# Patient Record
Sex: Male | Born: 2000
Health system: Southern US, Community
[De-identification: ages and names within clinical notes are randomized; demographics above are authoritative.]

## PROBLEM LIST (undated history)

## (undated) DIAGNOSIS — F32A Depression, unspecified: Secondary | ICD-10-CM

## (undated) DIAGNOSIS — F419 Anxiety disorder, unspecified: Secondary | ICD-10-CM

## (undated) HISTORY — PX: TONSILECTOMY, ADENOIDECTOMY, BILATERAL MYRINGOTOMY AND TUBES: SHX2538

## (undated) HISTORY — DX: Anxiety disorder, unspecified: F41.9

## (undated) HISTORY — DX: Depression, unspecified: F32.A

---
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
## 2018-01-20 ENCOUNTER — Ambulatory Visit: Payer: 59 | Admitting: Pediatrics

## 2018-01-30 ENCOUNTER — Ambulatory Visit: Payer: 59 | Admitting: Pediatrics

## 2018-01-30 ENCOUNTER — Encounter: Payer: Self-pay | Admitting: Pediatrics

## 2018-01-30 ENCOUNTER — Ambulatory Visit (INDEPENDENT_AMBULATORY_CARE_PROVIDER_SITE_OTHER): Payer: 59 | Admitting: Pediatrics

## 2018-01-30 VITALS — BP 138/78 | HR 83 | Ht 73.62 in | Wt 213.2 lb

## 2018-01-30 DIAGNOSIS — F649 Gender identity disorder, unspecified: Secondary | ICD-10-CM

## 2018-01-30 DIAGNOSIS — E349 Endocrine disorder, unspecified: Secondary | ICD-10-CM

## 2018-01-30 DIAGNOSIS — F4323 Adjustment disorder with mixed anxiety and depressed mood: Secondary | ICD-10-CM | POA: Diagnosis not present

## 2018-01-30 MED ORDER — BUPROPION HCL ER (XL) 300 MG PO TB24: 300.0000 mg | ORAL_TABLET | Freq: Every day | ORAL | 3 refills | Status: DC

## 2018-01-30 NOTE — Progress Notes (Signed)
History was provided by the patient.  Bruce Zuniga is a 18 y.o. male who is here for f/u of wellbutrin addition.  Trude Mcburney, FNP   HPI:  Pt reports that he hasn't noticed much of a change yet but no negative changes in mood like blunted affected. He is sleeping well at night.   Depression 4/10, anxiety 6/10. No thoughts of self harm.   Back to school on 13th. Switching majors. Art history/graphic design major.   No concerns with hormone medications. Still unable to take estradiol twice a day- taking 1-2x/week when he remembers.   No LMP for male patient.  Review of Systems  Constitutional: Negative for malaise/fatigue.  Eyes: Negative for double vision.  Respiratory: Negative for shortness of breath.   Cardiovascular: Negative for chest pain and palpitations.  Gastrointestinal: Negative for abdominal pain, constipation, diarrhea, nausea and vomiting.  Genitourinary: Negative for dysuria.  Musculoskeletal: Negative for joint pain and myalgias.  Skin: Negative for rash.  Neurological: Negative for dizziness and headaches.  Endo/Heme/Allergies: Does not bruise/bleed easily.  Psychiatric/Behavioral: Positive for depression. Negative for suicidal ideas. The patient is nervous/anxious. The patient does not have insomnia.     Patient Active Problem List   Diagnosis Date Noted  . Gender dysphoria 08/30/2017  . Family history of SIDS (sudden infant death syndrome) 05-08-2016  . Heart palpitations 05-08-2016  . Pectus excavatum 2016/05/08  . Precordial chest pain 05/08/16    Current Outpatient Medications on File Prior to Visit  Medication Sig Dispense Refill  . buPROPion (WELLBUTRIN XL) 150 MG 24 hr tablet Take 1 tablet (150 mg total) by mouth every morning. 30 tablet 2  . busPIRone (BUSPAR) 10 MG tablet Take 10 mg by mouth 3 (three) times daily.    Marland Kitchen estradiol (ESTRACE) 2 MG tablet Take 1 tablet (2 mg total) by mouth 2 (two) times daily. 180 tablet 1  .  medroxyPROGESTERone (PROVERA) 5 MG tablet Take 1 tablet (5 mg total) by mouth daily. 90 tablet 1  . sertraline (ZOLOFT) 50 MG tablet Take 50 mg by mouth daily.    Marland Kitchen spironolactone (ALDACTONE) 100 MG tablet Take 1.5 tablets daily for 2 weeks. If well tolerated after 2 weeks, increase to 2 tablets daily 60 tablet 2   No current facility-administered medications on file prior to visit.     Not on File   Physical Exam:    Vitals:   01/30/18 1058  BP: 138/78  Pulse: 83  Weight: 213 lb 3.2 oz (96.7 kg)  Height: 6' 1.62" (1.87 m)    Blood pressure percentiles are not available for patients who are 18 years or older.  Physical Exam Vitals signs reviewed.  Constitutional:      Appearance: He is well-developed.  HENT:     Head: Normocephalic.  Neck:     Thyroid: No thyromegaly.  Cardiovascular:     Rate and Rhythm: Normal rate and regular rhythm.     Heart sounds: Normal heart sounds.  Pulmonary:     Effort: Pulmonary effort is normal.     Breath sounds: Normal breath sounds.  Abdominal:     General: Bowel sounds are normal.     Palpations: Abdomen is soft.  Musculoskeletal: Normal range of motion.  Lymphadenopathy:     Cervical: No cervical adenopathy.  Skin:    General: Skin is warm and dry.  Neurological:     Mental Status: He is alert and oriented to person, place, and time.     Assessment/Plan:  1. Endocrine disorder Continue spironolactone, estradiol and provera daily. Discussed strategies for remembering second dose of estradiol.   2. Gender dysphoria Overall stable.   3. Adjustment disorder with mixed anxiety and depressed mood Will increase wellbutrin to 300 mg daily and continue low dose of zoloft. Still taking buspar, usually about twice a day. Overall please wellbutrin hasn't caused any blunting.  - buPROPion (WELLBUTRIN XL) 300 MG 24 hr tablet; Take 1 tablet (300 mg total) by mouth daily.  Dispense: 30 tablet; Refill: 3

## 2018-02-14 ENCOUNTER — Other Ambulatory Visit: Payer: Self-pay | Admitting: Pediatrics

## 2018-02-14 MED ORDER — BUPROPION HCL ER (XL) 150 MG PO TB24: 150.0000 mg | ORAL_TABLET | ORAL | 2 refills | Status: DC

## 2018-02-20 ENCOUNTER — Telehealth: Payer: Self-pay

## 2018-02-20 ENCOUNTER — Other Ambulatory Visit: Payer: Self-pay | Admitting: Pediatrics

## 2018-02-20 DIAGNOSIS — E349 Endocrine disorder, unspecified: Secondary | ICD-10-CM

## 2018-02-20 MED ORDER — SPIRONOLACTONE 100 MG PO TABS: 100.0000 mg | ORAL_TABLET | Freq: Two times a day (BID) | ORAL | 2 refills | Status: DC

## 2018-02-20 NOTE — Telephone Encounter (Signed)
Rec'd patient request asking if spironolactone could be sent to [facility]. Routing to provider.

## 2018-02-20 NOTE — Telephone Encounter (Signed)
Done

## 2018-02-20 NOTE — Telephone Encounter (Signed)
A user error has taken place: encounter opened in error, closed for administrative reasons.

## 2018-02-20 NOTE — Telephone Encounter (Signed)
PA submitted. Should take around 24-72 hours.PA number: [id number]

## 2018-03-15 ENCOUNTER — Encounter: Payer: Self-pay | Admitting: Pediatrics

## 2018-04-21 ENCOUNTER — Other Ambulatory Visit: Payer: Self-pay | Admitting: Pediatrics

## 2018-04-21 DIAGNOSIS — E349 Endocrine disorder, unspecified: Secondary | ICD-10-CM

## 2018-05-02 ENCOUNTER — Other Ambulatory Visit: Payer: Self-pay | Admitting: Pediatrics

## 2018-05-02 DIAGNOSIS — E349 Endocrine disorder, unspecified: Secondary | ICD-10-CM

## 2018-06-02 ENCOUNTER — Other Ambulatory Visit: Payer: Self-pay | Admitting: Pediatrics

## 2018-06-02 ENCOUNTER — Telehealth: Payer: Self-pay

## 2018-06-02 DIAGNOSIS — E349 Endocrine disorder, unspecified: Secondary | ICD-10-CM

## 2018-06-02 MED ORDER — MEDROXYPROGESTERONE ACETATE 5 MG PO TABS: 5.0000 mg | ORAL_TABLET | Freq: Every day | ORAL | 1 refills | Status: DC

## 2018-06-02 NOTE — Telephone Encounter (Signed)
Done

## 2018-06-02 NOTE — Telephone Encounter (Signed)
[facility] called asking for collaborating physician information.

## 2018-07-03 ENCOUNTER — Other Ambulatory Visit: Payer: Self-pay | Admitting: Pediatrics

## 2018-07-03 MED ORDER — ONDANSETRON HCL 8 MG PO TABS: 8.0000 mg | ORAL_TABLET | Freq: Three times a day (TID) | ORAL | 0 refills | Status: DC | PRN

## 2018-07-05 ENCOUNTER — Other Ambulatory Visit: Payer: Self-pay | Admitting: Family

## 2018-07-05 ENCOUNTER — Telehealth: Payer: Self-pay

## 2018-07-05 MED ORDER — SERTRALINE HCL 50 MG PO TABS: 50.0000 mg | ORAL_TABLET | Freq: Every day | ORAL | 0 refills | Status: DC

## 2018-07-05 NOTE — Telephone Encounter (Signed)
Refill request received from [facility] for Sertraline refill.Routing to provider.

## 2018-07-09 ENCOUNTER — Other Ambulatory Visit: Payer: Self-pay | Admitting: Family

## 2018-07-19 ENCOUNTER — Telehealth: Payer: Self-pay | Admitting: Family

## 2018-07-19 NOTE — Telephone Encounter (Signed)
Mom LVM re: scheduling an follow up with Alyse Low the week of July 20 just for a check in and lab draw to check hormone levels. Per mom, Bruce Zuniga has been "feeling kind of off lately."  LVM for mom letting her know that Alyse Low is on vacation that week, however we could potentially schedule Bruce Zuniga for a lab visit that week, then a virtual follow with [facility] for the following week. Routed to [place] as an [place].

## 2018-07-25 ENCOUNTER — Other Ambulatory Visit: Payer: Self-pay | Admitting: Family

## 2018-08-18 ENCOUNTER — Other Ambulatory Visit: Payer: Self-pay | Admitting: Pediatrics

## 2018-08-21 ENCOUNTER — Encounter: Payer: Self-pay | Admitting: Family

## 2018-08-21 ENCOUNTER — Ambulatory Visit (INDEPENDENT_AMBULATORY_CARE_PROVIDER_SITE_OTHER): Payer: 59 | Admitting: Family

## 2018-08-21 ENCOUNTER — Other Ambulatory Visit: Payer: Self-pay

## 2018-08-21 VITALS — BP 131/85 | HR 82 | Ht 73.62 in | Wt 216.6 lb

## 2018-08-21 DIAGNOSIS — F4323 Adjustment disorder with mixed anxiety and depressed mood: Secondary | ICD-10-CM

## 2018-08-21 DIAGNOSIS — R10819 Abdominal tenderness, unspecified site: Secondary | ICD-10-CM | POA: Diagnosis not present

## 2018-08-21 DIAGNOSIS — F649 Gender identity disorder, unspecified: Secondary | ICD-10-CM | POA: Diagnosis not present

## 2018-08-21 DIAGNOSIS — E349 Endocrine disorder, unspecified: Secondary | ICD-10-CM | POA: Diagnosis not present

## 2018-08-21 DIAGNOSIS — Z113 Encounter for screening for infections with a predominantly sexual mode of transmission: Secondary | ICD-10-CM

## 2018-08-21 MED ORDER — HYDROXYZINE HCL 10 MG PO TABS: 10.0000 mg | ORAL_TABLET | Freq: Three times a day (TID) | ORAL | 0 refills | Status: DC | PRN

## 2018-08-21 NOTE — Patient Instructions (Addendum)
Please try taking miralax to help with constipation. You can try doing 1 cap per day to become more regular (goal is to have one soft stool per day), can decrease the amount of miralax if needed.   Today we did genesight testing which will help [place] know how to best manage your medications. Keep taking Wellbutrin 150 mg daily and since you are not taking the Buspar consistently, you can stop that medicine.   I have prescribed Hydroxyzine 10 mg to help with breakthrough anxiety and sleep.    Once you have taken the Miralax clean-out, you can try ibuprofen 800 mg up to three times daily for your back pain.   Please send me a My Chart message in a few days to let me know how you are feeling.   I have scheduled you for a video visit in 2 weeks to discuss medications.
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
## 2018-08-22 ENCOUNTER — Encounter: Payer: Self-pay | Admitting: Family

## 2018-08-22 LAB — COMPREHENSIVE METABOLIC PANEL
AG Ratio: 1.6 (calc) (ref 1.0–2.5)
ALT: 7 U/L — ABNORMAL LOW (ref 8–46)
AST: 12 U/L (ref 12–32)
Albumin: 4.6 g/dL (ref 3.6–5.1)
Alkaline phosphatase (APISO): 79 U/L (ref 46–169)
BUN: 10 mg/dL (ref 7–20)
CO2: 23 mmol/L (ref 20–32)
Calcium: 9.8 mg/dL (ref 8.9–10.4)
Chloride: 103 mmol/L (ref 98–110)
Creat: 1.02 mg/dL (ref 0.60–1.26)
Globulin: 2.8 g/dL (calc) (ref 2.1–3.5)
Glucose, Bld: 92 mg/dL (ref 65–99)
Potassium: 4.5 mmol/L (ref 3.8–5.1)
Sodium: 136 mmol/L (ref 135–146)
Total Bilirubin: 0.4 mg/dL (ref 0.2–1.1)
Total Protein: 7.4 g/dL (ref 6.3–8.2)

## 2018-08-22 LAB — ESTRADIOL: Estradiol: 83 pg/mL — ABNORMAL HIGH (ref <=39)

## 2018-08-22 NOTE — Progress Notes (Signed)
Supervising Provider Co-Signature  I reviewed with the resident the medical history and the resident's findings on physical examination.  I discussed with the resident the patient's diagnosis and concur with the treatment plan as documented in the resident's note.  Parthenia Ames, NP

## 2018-08-25 ENCOUNTER — Encounter: Payer: Self-pay | Admitting: Family

## 2018-08-27 LAB — TESTOS,TOTAL,FREE AND SHBG (FEMALE)
Free Testosterone: 28.6 pg/mL — ABNORMAL LOW (ref 35.0–155.0)
Sex Hormone Binding: 47 nmol/L (ref 10–50)
Testosterone, Total, LC-MS-MS: 212 ng/dL — ABNORMAL LOW (ref 250–1100)

## 2018-09-06 ENCOUNTER — Encounter: Payer: Self-pay | Admitting: Family

## 2018-09-06 ENCOUNTER — Ambulatory Visit (INDEPENDENT_AMBULATORY_CARE_PROVIDER_SITE_OTHER): Payer: 59 | Admitting: Family

## 2018-09-06 DIAGNOSIS — F649 Gender identity disorder, unspecified: Secondary | ICD-10-CM

## 2018-09-06 DIAGNOSIS — F4323 Adjustment disorder with mixed anxiety and depressed mood: Secondary | ICD-10-CM

## 2018-09-06 NOTE — Progress Notes (Addendum)
Virtual Visit via Video Note  I connected with Bruce Zuniga 's patient  on 09/06/18 at  2:30 PM EDT by a video enabled telemedicine application and verified that I am speaking with the correct person using two identifiers.   Location of patient/parent: home   I discussed the limitations of evaluation and management by telemedicine and the availability of in person appointments.  I discussed that the purpose of this telehealth visit is to provide medical care while limiting exposure to the novel coronavirus.  The patient expressed understanding and agreed to proceed.  Reason for visit:  Med check follow up gender dysphoria, anxiety and depression  History of Present Illness:  Bruce Zuniga is a 20 old with PMH significant for gender dysphoria and adjustment disorder with mixed anxiety and depressed mood. They was most recently seen on 7/27. They report they are doing well on current medications which include spironolactone, estradiol and progesterone. Patient reports they are happy and not having any issues or concerns currently. They are not taking zoloft and feeling fine with just continuing wellbutrin at 150 mg daily. They have only needed hydroxyzine sparingly for break through anxiety which has helped. Rest of ROS negative. No other concerns.    Observations/Objective: Patient appears well and is pleasant to talk to.  Assessment and Plan:   Gender dysphoria  -Patient happy with progress -continue spironolactone, estradiol and progesterone. Labs look good according to medication regiment   Adjustment disorder with mixed anxiety and depressed mood  -continue wellbutrin at 150 mg daily -continue hydroxyzine as needed for anxiety PRN -genetic panel completed  Follow Up Instructions: follow up in 3 months   I discussed the assessment and treatment plan with the patient and/or parent/guardian. They were provided an opportunity to ask questions and all were answered. They agreed with the plan and  demonstrated an understanding of the instructions.   They were advised to call back or seek an in-person evaluation in the emergency room if the symptoms worsen or if the condition fails to improve as anticipated.  I spent 15 minutes on this telehealth visit inclusive of face-to-face video and care coordination time I was located at off site location during this encounter.  Mellody Drown, MD

## 2018-09-07 ENCOUNTER — Encounter: Payer: Self-pay | Admitting: Family

## 2018-09-07 NOTE — Progress Notes (Signed)
Supervising Provider Co-Signature  I reviewed with the resident the medical history and the resident's findings on physical examination.  I discussed with the resident the patient's diagnosis and concur with the treatment plan as documented in the resident's note.  Parthenia Ames, NP

## 2018-10-09 ENCOUNTER — Other Ambulatory Visit: Payer: Self-pay | Admitting: Pediatrics

## 2018-10-09 DIAGNOSIS — E349 Endocrine disorder, unspecified: Secondary | ICD-10-CM

## 2018-10-20 ENCOUNTER — Other Ambulatory Visit: Payer: Self-pay | Admitting: Pediatrics

## 2018-10-20 DIAGNOSIS — E349 Endocrine disorder, unspecified: Secondary | ICD-10-CM

## 2018-11-05 ENCOUNTER — Other Ambulatory Visit: Payer: Self-pay | Admitting: Pediatrics

## 2018-11-05 DIAGNOSIS — E349 Endocrine disorder, unspecified: Secondary | ICD-10-CM

## 2019-01-05 ENCOUNTER — Other Ambulatory Visit: Payer: Self-pay | Admitting: Family

## 2019-03-14 ENCOUNTER — Other Ambulatory Visit: Payer: Self-pay | Admitting: Pediatrics

## 2019-03-14 ENCOUNTER — Telehealth (INDEPENDENT_AMBULATORY_CARE_PROVIDER_SITE_OTHER): Payer: Self-pay | Admitting: Pediatrics

## 2019-03-14 DIAGNOSIS — F410 Panic disorder [episodic paroxysmal anxiety] without agoraphobia: Secondary | ICD-10-CM

## 2019-03-14 DIAGNOSIS — F649 Gender identity disorder, unspecified: Secondary | ICD-10-CM

## 2019-03-14 DIAGNOSIS — F4323 Adjustment disorder with mixed anxiety and depressed mood: Secondary | ICD-10-CM

## 2019-03-14 DIAGNOSIS — K5900 Constipation, unspecified: Secondary | ICD-10-CM

## 2019-03-14 MED ORDER — SENNA 8.6 MG PO TABS: 1.0000 | ORAL_TABLET | Freq: Every day | ORAL | 0 refills | Status: DC

## 2019-03-14 MED ORDER — VENLAFAXINE HCL ER 37.5 MG PO CP24: 75.0000 mg | ORAL_CAPSULE | Freq: Every day | ORAL | 0 refills | Status: DC

## 2019-03-14 MED ORDER — HYDROXYZINE HCL 10 MG PO TABS: 10.0000 mg | ORAL_TABLET | Freq: Three times a day (TID) | ORAL | 2 refills | Status: DC | PRN

## 2019-03-14 NOTE — Progress Notes (Signed)
This note is not being shared with the patient for the following reason: To prevent harm (release of this note would result in harm to the life or physical safety of the patient or another).  THIS RECORD MAY CONTAIN CONFIDENTIAL INFORMATION THAT SHOULD NOT BE RELEASED WITHOUT REVIEW OF THE SERVICE PROVIDER.  Virtual Follow-Up Visit via Video Note  I connected with [name] on 03/14/19 at 11:30 AM EST by a video enabled telemedicine application and verified that I am speaking with the correct person using two identifiers.   Patient/parent location: Home in [place]   I discussed the limitations of evaluation and management by telemedicine and the availability of in person appointments.  I discussed that the purpose of this telehealth visit is to provide medical care while limiting exposure to the novel coronavirus.  The patient expressed understanding and agreed to proceed.   Bruce Zuniga is a 19 y.o. male referred by Trude Mcburney, FNP here today for follow-up of adjustment disorder with mixed anxiety/depression  Previsit planning completed:  yes   History was provided by the patient.  Plan from Last Visit:   Gender dysphoria- continue with spironolactone, estradiol, and progesterone at current doses Adjustment disorder- continue wellbutrin 150 mg daily, continue hydroxyzine PRN, gene panel completed   Chief Complaint: Anxiety, panic attacks  History of Present Illness:  Bruce Zuniga is a 19 year old with history of adjustment disorder with mixed anxiety and depression, as well as gender dysphoria that presents with increased anxiety and panic attacks over the last several weeks.   Reports "heavy stress" recently including difficulty finding housing as they will no longer be allowed to stay in their current apartment past August and withdraw of support by parents initially, although they are now providing financial support. Has had difficulty keeping a job during the pandemic.   Feels  that the stress has been building and came to a 'fever pitch' in the last several days where patient reports several panic attacks, one of which led him to the ED for further testing. Labs and EKG were normal but did have elevated BP w/ systolics in [id number]. Had not been using hydroxyzine for breakthrough anxiety as he reports not having realized he had additional pills at home.   Has been doing well taking Wellbutrin 150 mg daily.   Reports medication compliance with spironolactone, estradiol, progesterone. Does not require refills. Has not had labs done since 07/2018.   In addition, reports continues to have issues with constipation which he thinks may be secondary to IBS. Uses miralax when things get really bad but does not have a regular regimen. Flank pain has not been an issue since starting miralax for constipation which he is happy about.   No current SI/HI.   ROS:   All other systems reviewed and negative except as stated in the HPI.   Not on File Outpatient Medications Prior to Visit  Medication Sig Dispense Refill  . buPROPion (WELLBUTRIN XL) 150 MG 24 hr tablet TAKE 1 TABLET BY MOUTH EVERY MORNING 30 tablet 2  . busPIRone (BUSPAR) 10 MG tablet Take 10 mg by mouth 3 (three) times daily.    Marland Kitchen estradiol (ESTRACE) 2 MG tablet TAKE 1 TABLET BY MOUTH  TWICE DAILY 180 tablet 3  . medroxyPROGESTERone (PROVERA) 5 MG tablet TAKE 1 TABLET BY MOUTH  DAILY 90 tablet 3  . ondansetron (ZOFRAN) 8 MG tablet Take 1 tablet (8 mg total) by mouth every 8 (eight) hours as needed for nausea or  vomiting. (Patient not taking: Reported on 08/21/2018) 20 tablet 0  . spironolactone (ALDACTONE) 100 MG tablet TAKE 1 TABLET BY MOUTH TWO  TIMES DAILY 180 tablet 3  . hydrOXYzine (ATARAX/VISTARIL) 10 MG tablet Take 1 tablet (10 mg total) by mouth 3 (three) times daily as needed. 30 tablet 0  . sertraline (ZOLOFT) 50 MG tablet TAKE 1 TABLET BY MOUTH  DAILY (Patient not taking: Reported on 08/21/2018) 90 tablet 0   No  facility-administered medications prior to visit.     Patient Active Problem List   Diagnosis Date Noted  . Gender dysphoria 08/30/2017  . Family history of SIDS (sudden infant death syndrome) 2016-04-24  . Heart palpitations 04-24-16  . Pectus excavatum 24-Apr-2016  . Precordial chest pain 04/24/16    Visual Observations/Objective: General Appearance: Well nourished well developed, in no apparent distress.  Eyes: conjunctiva normal ENT/Mouth: No hoarseness, No cough for duration of visit.  Respiratory: Respiratory effort normal, normal rate, no retractions or distress.   Cardio: Appears well-perfused, noncyanotic Musculoskeletal: no obvious deformity Skin: visible skin without rashes, ecchymosis, erythema Neuro: Awake and oriented X 3,  Psych:  normal affect, Insight and Judgment appropriate.    Assessment/Plan: 1. Adjustment disorder with mixed anxiety and depressed mood Given increased anxiety and frequency of panic attacks over last several weeks with additional situational stress, will start Effexor (37.5 mg x 1 week, then increase to 75 mg) based on gene panel testing.  Continue wellbutrin 150 mg daily Continue hydroxyzine 10 mg TID PRN breakthrough anxiety - hydrOXYzine (ATARAX/VISTARIL) 10 MG tablet; Take 1 tablet (10 mg total) by mouth 3 (three) times daily as needed.  Dispense: 90 tablet; Refill: 2 - venlafaxine XR (EFFEXOR XR) 37.5 MG 24 hr capsule; Take 2 capsules (75 mg total) by mouth daily with breakfast. For week 1- take 1 capsule 37.5 mg daily, then increase to 75 mg daily  Dispense: 60 capsule; Refill: 0  2. Gender dysphoria Continue spironolactone, estradiol, progesterone Needs labs at next visit  3. Constipation, unspecified constipation type Continues to have constipation likely secondary to IBS in setting of stress Will start senna daily for regular bowel regimen Miralax PRN  - senna (SENOKOT) 8.6 MG TABS tablet; Take 1 tablet (8.6 mg total) by mouth  daily.  Dispense: 120 tablet; Refill: 0   I discussed the assessment and treatment plan with the patient and/or parent/guardian.  They were provided an opportunity to ask questions and all were answered.  They agreed with the plan and demonstrated an understanding of the instructions. They were advised to call back or seek an in-person evaluation in the emergency room if the symptoms worsen or if the condition fails to improve as anticipated.   Follow-up:  In 2-3 weeks for in person visit for labs, exam  Medical decision-making:   I spent 30 minutes on this telehealth visit inclusive of face-to-face video and care coordination time I was located remotely during this encounter.   Dorna Leitz, MD    CC: Trude Mcburney, FNP, Trude Mcburney, FNP

## 2019-03-16 ENCOUNTER — Other Ambulatory Visit: Payer: Self-pay | Admitting: Pediatrics

## 2019-03-16 DIAGNOSIS — E349 Endocrine disorder, unspecified: Secondary | ICD-10-CM

## 2019-03-16 MED ORDER — ESTRADIOL 2 MG PO TABS: 2.0000 mg | ORAL_TABLET | Freq: Two times a day (BID) | ORAL | 3 refills | Status: DC

## 2019-03-16 NOTE — Progress Notes (Signed)
I have reviewed the resident's note and plan of care and helped develop the plan as necessary.  Phi Avans, FNP   

## 2019-03-26 ENCOUNTER — Ambulatory Visit: Payer: Self-pay | Admitting: Pediatrics

## 2019-03-28 ENCOUNTER — Telehealth: Payer: Self-pay | Admitting: Pediatrics

## 2019-04-14 ENCOUNTER — Other Ambulatory Visit: Payer: Self-pay | Admitting: Student

## 2019-04-14 DIAGNOSIS — F4323 Adjustment disorder with mixed anxiety and depressed mood: Secondary | ICD-10-CM

## 2019-04-23 ENCOUNTER — Other Ambulatory Visit: Payer: Self-pay | Admitting: Pediatrics

## 2019-04-23 MED ORDER — BUPROPION HCL ER (XL) 150 MG PO TB24: 150.0000 mg | ORAL_TABLET | Freq: Every morning | ORAL | 2 refills | Status: DC

## 2019-04-23 MED ORDER — VENLAFAXINE HCL ER 75 MG PO CP24: 75.0000 mg | ORAL_CAPSULE | Freq: Every day | ORAL | 2 refills | Status: DC

## 2019-06-21 ENCOUNTER — Other Ambulatory Visit: Payer: Self-pay | Admitting: Pediatrics

## 2019-06-21 MED ORDER — BUPROPION HCL ER (XL) 150 MG PO TB24: 150.0000 mg | ORAL_TABLET | Freq: Every morning | ORAL | 2 refills | Status: DC

## 2019-06-22 ENCOUNTER — Encounter: Payer: Self-pay | Admitting: Pediatrics

## 2019-07-04 ENCOUNTER — Telehealth: Payer: Self-pay | Admitting: General Practice

## 2019-07-04 NOTE — Telephone Encounter (Signed)

## 2019-07-05 ENCOUNTER — Ambulatory Visit: Payer: Commercial Managed Care - PPO | Admitting: *Deleted

## 2019-07-05 DIAGNOSIS — E349 Endocrine disorder, unspecified: Secondary | ICD-10-CM

## 2019-07-05 DIAGNOSIS — F4323 Adjustment disorder with mixed anxiety and depressed mood: Secondary | ICD-10-CM

## 2019-07-05 NOTE — Progress Notes (Signed)
Patient came in for labs Testos, Total, Free and SHBG (Male), CMP, CBC with diff, and Vitamin D 25 Hydroxy. Labs ordered by Jonathon Resides. Successful collection.

## 2019-07-08 LAB — COMPREHENSIVE METABOLIC PANEL
AG Ratio: 1.8 (calc) (ref 1.0–2.5)
ALT: 8 U/L — ABNORMAL LOW (ref 9–46)
AST: 13 U/L (ref 10–40)
Albumin: 4.6 g/dL (ref 3.6–5.1)
Alkaline phosphatase (APISO): 86 U/L (ref 36–130)
BUN: 14 mg/dL (ref 7–25)
CO2: 25 mmol/L (ref 20–32)
Calcium: 9.9 mg/dL (ref 8.6–10.3)
Chloride: 102 mmol/L (ref 98–110)
Creat: 1.16 mg/dL (ref 0.60–1.35)
Globulin: 2.6 g/dL (calc) (ref 1.9–3.7)
Glucose, Bld: 94 mg/dL (ref 65–99)
Potassium: 4.8 mmol/L (ref 3.5–5.3)
Sodium: 136 mmol/L (ref 135–146)
Total Bilirubin: 0.5 mg/dL (ref 0.2–1.2)
Total Protein: 7.2 g/dL (ref 6.1–8.1)

## 2019-07-08 LAB — CBC WITH DIFFERENTIAL/PLATELET
Absolute Monocytes: 350 cells/uL (ref 200–950)
Basophils Absolute: 59 cells/uL (ref 0–200)
Basophils Relative: 0.9 %
Eosinophils Absolute: 92 cells/uL (ref 15–500)
Eosinophils Relative: 1.4 %
HCT: 45.8 % (ref 38.5–50.0)
Hemoglobin: 15.3 g/dL (ref 13.2–17.1)
Lymphs Abs: 2264 cells/uL (ref 850–3900)
MCH: 28.7 pg (ref 27.0–33.0)
MCHC: 33.4 g/dL (ref 32.0–36.0)
MCV: 85.9 fL (ref 80.0–100.0)
MPV: 9.5 fL (ref 7.5–12.5)
Monocytes Relative: 5.3 %
Neutro Abs: 3835 cells/uL (ref 1500–7800)
Neutrophils Relative %: 58.1 %
Platelets: 300 10*3/uL (ref 140–400)
RBC: 5.33 10*6/uL (ref 4.20–5.80)
RDW: 11.8 % (ref 11.0–15.0)
Total Lymphocyte: 34.3 %
WBC: 6.6 10*3/uL (ref 3.8–10.8)

## 2019-07-08 LAB — TESTOS,TOTAL,FREE AND SHBG (FEMALE)
Free Testosterone: 41.3 pg/mL (ref 35.0–155.0)
Sex Hormone Binding: 42 nmol/L (ref 10–50)
Testosterone, Total, LC-MS-MS: 323 ng/dL (ref 250–1100)

## 2019-07-08 LAB — VITAMIN D 25 HYDROXY (VIT D DEFICIENCY, FRACTURES): Vit D, 25-Hydroxy: 15 ng/mL — ABNORMAL LOW (ref 30–100)

## 2019-07-09 ENCOUNTER — Other Ambulatory Visit: Payer: Self-pay | Admitting: Family

## 2019-07-09 MED ORDER — VITAMIN D (ERGOCALCIFEROL) 1.25 MG (50000 UNIT) PO CAPS
50000.0000 [IU] | ORAL_CAPSULE | ORAL | 0 refills | Status: DC
Start: 2019-07-09 — End: 2019-07-16

## 2019-07-16 ENCOUNTER — Other Ambulatory Visit: Payer: Self-pay | Admitting: Pediatrics

## 2019-07-16 MED ORDER — VITAMIN D (ERGOCALCIFEROL) 1.25 MG (50000 UNIT) PO CAPS: 50000.0000 [IU] | ORAL_CAPSULE | ORAL | 0 refills | Status: DC

## 2019-07-18 ENCOUNTER — Other Ambulatory Visit: Payer: Self-pay | Admitting: Pediatrics

## 2019-09-09 ENCOUNTER — Other Ambulatory Visit: Payer: Self-pay | Admitting: Pediatrics

## 2019-11-30 ENCOUNTER — Other Ambulatory Visit: Payer: Self-pay | Admitting: Pediatrics

## 2019-12-11 ENCOUNTER — Other Ambulatory Visit: Payer: Self-pay | Admitting: Pediatrics

## 2020-01-09 ENCOUNTER — Other Ambulatory Visit: Payer: Self-pay | Admitting: Family

## 2020-01-10 ENCOUNTER — Other Ambulatory Visit: Payer: Self-pay | Admitting: Pediatrics

## 2020-01-10 DIAGNOSIS — E349 Endocrine disorder, unspecified: Secondary | ICD-10-CM

## 2020-01-10 MED ORDER — MEDROXYPROGESTERONE ACETATE 5 MG PO TABS: 5.0000 mg | ORAL_TABLET | Freq: Every day | ORAL | 3 refills | Status: DC

## 2020-01-10 MED ORDER — BUPROPION HCL ER (XL) 150 MG PO TB24: 150.0000 mg | ORAL_TABLET | Freq: Every morning | ORAL | 2 refills | Status: DC

## 2020-03-04 ENCOUNTER — Other Ambulatory Visit: Payer: Self-pay | Admitting: Pediatrics

## 2020-03-04 DIAGNOSIS — E349 Endocrine disorder, unspecified: Secondary | ICD-10-CM

## 2020-04-01 ENCOUNTER — Other Ambulatory Visit: Payer: Self-pay | Admitting: Family

## 2020-04-01 ENCOUNTER — Telehealth: Payer: Self-pay

## 2020-04-01 DIAGNOSIS — E349 Endocrine disorder, unspecified: Secondary | ICD-10-CM

## 2020-04-01 MED ORDER — SPIRONOLACTONE 100 MG PO TABS: 100.0000 mg | ORAL_TABLET | Freq: Two times a day (BID) | ORAL | 3 refills | Status: DC

## 2020-04-01 NOTE — Telephone Encounter (Signed)
Pt asked for refill for spironolactone (ALDACTONE) 100 MG tablet. Routing to provider.

## 2020-04-03 NOTE — Telephone Encounter (Signed)
Script refilled

## 2020-04-21 ENCOUNTER — Other Ambulatory Visit: Payer: Self-pay | Admitting: Student

## 2020-04-21 DIAGNOSIS — F4323 Adjustment disorder with mixed anxiety and depressed mood: Secondary | ICD-10-CM

## 2020-05-06 ENCOUNTER — Other Ambulatory Visit: Payer: Self-pay | Admitting: Pediatrics

## 2020-06-09 ENCOUNTER — Other Ambulatory Visit: Payer: Self-pay | Admitting: Pediatrics

## 2020-06-10 ENCOUNTER — Other Ambulatory Visit: Payer: Self-pay | Admitting: Pediatrics

## 2020-10-03 ENCOUNTER — Other Ambulatory Visit: Payer: Self-pay | Admitting: Pediatrics

## 2020-10-15 ENCOUNTER — Other Ambulatory Visit: Payer: Self-pay | Admitting: Pediatrics

## 2021-03-26 ENCOUNTER — Other Ambulatory Visit: Payer: Self-pay | Admitting: Pediatrics

## 2021-04-30 ENCOUNTER — Ambulatory Visit (INDEPENDENT_AMBULATORY_CARE_PROVIDER_SITE_OTHER): Payer: Commercial Managed Care - PPO | Admitting: Medical-Surgical

## 2021-04-30 ENCOUNTER — Encounter: Payer: Self-pay | Admitting: Medical-Surgical

## 2021-04-30 VITALS — BP 133/88 | HR 82 | Resp 20 | Wt 239.0 lb

## 2021-04-30 DIAGNOSIS — Z789 Other specified health status: Secondary | ICD-10-CM | POA: Diagnosis not present

## 2021-04-30 DIAGNOSIS — R002 Palpitations: Secondary | ICD-10-CM | POA: Diagnosis not present

## 2021-04-30 DIAGNOSIS — K219 Gastro-esophageal reflux disease without esophagitis: Secondary | ICD-10-CM

## 2021-04-30 DIAGNOSIS — Z113 Encounter for screening for infections with a predominantly sexual mode of transmission: Secondary | ICD-10-CM

## 2021-04-30 DIAGNOSIS — Z7689 Persons encountering health services in other specified circumstances: Secondary | ICD-10-CM | POA: Diagnosis not present

## 2021-04-30 MED ORDER — FAMOTIDINE 20 MG PO TABS: 20.0000 mg | ORAL_TABLET | Freq: Two times a day (BID) | ORAL | 2 refills | Status: DC

## 2021-04-30 NOTE — Progress Notes (Signed)
? ?New Patient Office Visit ? ?Subjective:  ?Patient ID: Bruce Zuniga, adult    DOB: 2000/10/25  Age: 21 y.o. MRN: [id number] ? ?CC:  ?Chief Complaint  ?Patient presents with  ? Establish Care  ? ? ? ?HPI ?[name] presents to establish care. They are a 21 year old transgender male who is currently on oral Estradiol '4mg'$  daily, Progesterone '200mg'$  daily, and Spironolactone '25mg'$  daily. Has been on this therapy for a while and is stable on this treatment. Last labs done in August with their previous PCP.  ? ?Currently being treated for anxiety and depression with Effexor '75mg'$  and Wellbutrin '150mg'$  daily. Tolerating both medications well without side effects. Feels the medications are working well and notes that missed doses cause some significant symptoms. Denies SI/HI.  ? ?Has been having intermittent fluttering his their chest for several months but over the last couple of weeks has been having this happen around twice per day. This isn't painful and only lasts a second. Not accompanied by any other symptoms. Wonders if it's related to anxiety.  ? ?Having issues with reflux recently. Family history of GERD with complications including throat cancer. Is worried about their reflux being unmanaged. Interested in options for treatment.  ? ?Past Medical History:  ?Diagnosis Date  ? Anxiety   ? Depression   ? ? ?Past Surgical History:  ?Procedure Laterality Date  ? TONSILECTOMY, ADENOIDECTOMY, BILATERAL MYRINGOTOMY AND TUBES    ? ? ?Family History  ?Problem Relation Age of Onset  ? Hypertension Father   ? ? ?Social History  ? ?Socioeconomic History  ? Marital status: Single  ?  Spouse name: Not on file  ? Number of children: Not on file  ? Years of education: Not on file  ? Highest education level: Not on file  ?Occupational History  ? Not on file  ?Tobacco Use  ? Smoking status: Never  ? Smokeless tobacco: Never  ?Substance and Sexual Activity  ? Alcohol use: Yes  ?  Alcohol/week: 4.0 standard drinks  ?  Types: 4  Standard drinks or equivalent per week  ?  Comment: Sometimes  ? Drug use: Yes  ?  Types: Marijuana  ? Sexual activity: Yes  ?  Birth control/protection: Condom  ?  Comment: Transgender  ?Other Topics Concern  ? Not on file  ?Social History Narrative  ? Not on file  ? ?Social Determinants of Health  ? ?Financial Resource Strain: Not on file  ?Food Insecurity: Not on file  ?Transportation Needs: Not on file  ?Physical Activity: Not on file  ?Stress: Not on file  ?Social Connections: Not on file  ?Intimate Partner Violence: Not on file  ? ? ?ROS ?Review of Systems  ?Constitutional:  Negative for chills, fatigue, fever and unexpected weight change.  ?HENT:  Negative for congestion, rhinorrhea, sinus pressure and sore throat.   ?Respiratory:  Negative for cough, chest tightness and shortness of breath.   ?Cardiovascular:  Positive for palpitations. Negative for chest pain and leg swelling.  ?Gastrointestinal:  Positive for abdominal pain (heartburn). Negative for constipation, diarrhea, nausea and vomiting.  ?Endocrine: Negative for cold intolerance and heat intolerance.  ?Genitourinary:  Negative for dysuria, frequency, urgency, vaginal bleeding and vaginal discharge.  ?Skin:  Negative for rash and wound.  ?Neurological:  Negative for dizziness, light-headedness and headaches.  ?Hematological:  Does not bruise/bleed easily.  ?Psychiatric/Behavioral:  Positive for dysphoric mood. Negative for self-injury, sleep disturbance and suicidal ideas. The patient is nervous/anxious.   ? ?Objective:  ? ?  Today's Vitals: BP 133/88   Pulse 82   Resp 20   Wt 239 lb (108.4 kg)   SpO2 98%   BMI 31.00 kg/m?  ? ?Physical Exam ?Vitals reviewed.  ?Constitutional:   ?   General: He is not in acute distress. ?   Appearance: Normal appearance. He is not ill-appearing.  ?HENT:  ?   Head: Normocephalic and atraumatic.  ?Cardiovascular:  ?   Rate and Rhythm: Normal rate and regular rhythm.  ?   Pulses: Normal pulses.  ?   Heart sounds:  Normal heart sounds. No murmur heard. ?  No friction rub. No gallop.  ?Pulmonary:  ?   Effort: Pulmonary effort is normal. No respiratory distress.  ?   Breath sounds: Normal breath sounds. No wheezing.  ?Skin: ?   General: Skin is warm and dry.  ?Neurological:  ?   Mental Status: He is alert and oriented to person, place, and time.  ?Psychiatric:     ?   Mood and Affect: Mood normal.     ?   Behavior: Behavior normal.     ?   Thought Content: Thought content normal.     ?   Judgment: Judgment normal.  ? ? ?Assessment & Plan:  ? ?1. Encounter to establish care ?Reviewed available information and discussed care concerns with patient.  ? ?2. Male-to-male transgender person ?Checking labs. Continue Estradiol, Progesterone, and Spironolactone as prescribed. Plan to titrate medication depending on lab results.  ?- CBC with Differential/Platelet ?- Lipid panel ?- Estradiol ?- Testosterone ?- COMPLETE METABOLIC PANEL WITH GFR ? ?3. Palpitations ?Checking labs as above plus TSH. Possibly related to anxiety. May consider a long term monitor for further evaluation.  ?- TSH ?- COMPLETE METABOLIC PANEL WITH GFR ? ?4. Gastroesophageal reflux disease without esophagitis ?Famotidine '20mg'$  BID prn.  ? ?5. Routine screening for STI (sexually transmitted infection) ?STI testing per patient request.  ?- HIV Antibody (routine testing w rflx) ?- Hepatitis C Antibody ?- Hepatitis B surface antigen ?- RPR ?- C. trachomatis/N. gonorrhoeae RNA ? ? ?Outpatient Encounter Medications as of 04/30/2021  ?Medication Sig  ? buPROPion (WELLBUTRIN XL) 150 MG 24 hr tablet TAKE 1 TABLET BY MOUTH EVERY MORNING  ? estradiol (ESTRACE) 2 MG tablet TAKE 1 TABLET(2 MG) BY MOUTH TWICE DAILY  ? famotidine (PEPCID) 20 MG tablet Take 1 tablet (20 mg total) by mouth 2 (two) times daily.  ? medroxyPROGESTERone (PROVERA) 5 MG tablet Take 1 tablet (5 mg total) by mouth daily.  ? spironolactone (ALDACTONE) 100 MG tablet Take 1 tablet (100 mg total) by mouth 2 (two)  times daily.  ? venlafaxine XR (EFFEXOR-XR) 75 MG 24 hr capsule TAKE 1 CAPSULE(75 MG) BY MOUTH DAILY WITH BREAKFAST  ? [DISCONTINUED] buPROPion (WELLBUTRIN XL) 150 MG 24 hr tablet TAKE 1 TABLET(150 MG) BY MOUTH EVERY MORNING (Patient not taking: Reported on 04/30/2021)  ? [DISCONTINUED] busPIRone (BUSPAR) 10 MG tablet Take 10 mg by mouth 3 (three) times daily. (Patient not taking: Reported on 04/30/2021)  ? [DISCONTINUED] hydrOXYzine (ATARAX/VISTARIL) 10 MG tablet TAKE 1 TABLET(10 MG) BY MOUTH THREE TIMES DAILY AS NEEDED (Patient not taking: Reported on 04/30/2021)  ? [DISCONTINUED] senna (SENOKOT) 8.6 MG TABS tablet Take 1 tablet (8.6 mg total) by mouth daily. (Patient not taking: Reported on 04/30/2021)  ? [DISCONTINUED] Vitamin D, Ergocalciferol, (DRISDOL) 1.25 MG (50000 UNIT) CAPS capsule TAKE 1 CAPSULE BY MOUTH EVERY 7 DAYS (Patient not taking: Reported on 04/30/2021)  ? ?No facility-administered encounter medications  on file as of 04/30/2021.  ? ? ?Follow-up: Return in about 6 months (around 10/30/2021) for transgender care follow up.  ? ?Clearnce Sorrel, DNP, APRN, FNP-BC ?[place] ?Primary Care and Sports Medicine ? ?

## 2021-05-01 ENCOUNTER — Encounter: Payer: Self-pay | Admitting: Medical-Surgical

## 2021-05-01 LAB — COMPLETE METABOLIC PANEL WITH GFR
AG Ratio: 1.7 (calc) (ref 1.0–2.5)
ALT: 8 U/L — ABNORMAL LOW (ref 9–46)
AST: 13 U/L (ref 10–40)
Albumin: 4.5 g/dL (ref 3.6–5.1)
Alkaline phosphatase (APISO): 76 U/L (ref 36–130)
BUN: 10 mg/dL (ref 7–25)
CO2: 27 mmol/L (ref 20–32)
Calcium: 9.6 mg/dL (ref 8.6–10.3)
Chloride: 104 mmol/L (ref 98–110)
Creat: 0.85 mg/dL (ref 0.60–1.24)
Globulin: 2.7 g/dL (calc) (ref 1.9–3.7)
Glucose, Bld: 87 mg/dL (ref 65–139)
Potassium: 3.9 mmol/L (ref 3.5–5.3)
Sodium: 138 mmol/L (ref 135–146)
Total Bilirubin: 0.7 mg/dL (ref 0.2–1.2)
Total Protein: 7.2 g/dL (ref 6.1–8.1)
eGFR: 126 mL/min/{1.73_m2} (ref >=60)

## 2021-05-01 LAB — LIPID PANEL
Cholesterol: 170 mg/dL (ref <200)
HDL: 46 mg/dL (ref >=40)
LDL Cholesterol (Calc): 100 mg/dL (calc) — ABNORMAL HIGH
Non-HDL Cholesterol (Calc): 124 mg/dL (calc) (ref <130)
Total CHOL/HDL Ratio: 3.7 (calc) (ref <5.0)
Triglycerides: 138 mg/dL (ref <150)

## 2021-05-01 LAB — HEPATITIS C ANTIBODY
Hepatitis C Ab: NONREACTIVE
SIGNAL TO CUT-OFF: 0.09 (ref <1.00)

## 2021-05-01 LAB — CBC WITH DIFFERENTIAL/PLATELET
Absolute Monocytes: 360 cells/uL (ref 200–950)
Basophils Absolute: 41 cells/uL (ref 0–200)
Basophils Relative: 0.6 %
Eosinophils Absolute: 122 cells/uL (ref 15–500)
Eosinophils Relative: 1.8 %
HCT: 42.1 % (ref 38.5–50.0)
Hemoglobin: 14.3 g/dL (ref 13.2–17.1)
Lymphs Abs: 2530 cells/uL (ref 850–3900)
MCH: 29.1 pg (ref 27.0–33.0)
MCHC: 34 g/dL (ref 32.0–36.0)
MCV: 85.7 fL (ref 80.0–100.0)
MPV: 9.7 fL (ref 7.5–12.5)
Monocytes Relative: 5.3 %
Neutro Abs: 3747 cells/uL (ref 1500–7800)
Neutrophils Relative %: 55.1 %
Platelets: 279 10*3/uL (ref 140–400)
RBC: 4.91 10*6/uL (ref 4.20–5.80)
RDW: 11.9 % (ref 11.0–15.0)
Total Lymphocyte: 37.2 %
WBC: 6.8 10*3/uL (ref 3.8–10.8)

## 2021-05-01 LAB — RPR: RPR Ser Ql: NONREACTIVE

## 2021-05-01 LAB — TSH: TSH: 1.16 mIU/L (ref 0.40–4.50)

## 2021-05-01 LAB — HIV ANTIBODY (ROUTINE TESTING W REFLEX): HIV 1&2 Ab, 4th Generation: NONREACTIVE

## 2021-05-01 LAB — HEPATITIS B SURFACE ANTIGEN: Hepatitis B Surface Ag: NONREACTIVE

## 2021-05-01 LAB — ESTRADIOL: Estradiol: 71 pg/mL — ABNORMAL HIGH (ref <=39)

## 2021-05-01 LAB — TESTOSTERONE: Testosterone: 330 ng/dL (ref 250–827)

## 2021-05-01 LAB — C. TRACHOMATIS/N. GONORRHOEAE RNA
C. trachomatis RNA, TMA: NOT DETECTED
N. gonorrhoeae RNA, TMA: NOT DETECTED

## 2021-05-02 ENCOUNTER — Encounter: Payer: Self-pay | Admitting: Medical-Surgical

## 2021-05-20 ENCOUNTER — Encounter: Payer: Self-pay | Admitting: Medical-Surgical

## 2021-05-20 DIAGNOSIS — E349 Endocrine disorder, unspecified: Secondary | ICD-10-CM

## 2021-05-21 MED ORDER — SPIRONOLACTONE 25 MG PO TABS: 100.0000 mg | ORAL_TABLET | Freq: Two times a day (BID) | ORAL | 1 refills | Status: DC

## 2021-06-23 ENCOUNTER — Encounter: Payer: Self-pay | Admitting: Medical-Surgical

## 2021-06-29 ENCOUNTER — Other Ambulatory Visit: Payer: Self-pay

## 2021-06-29 ENCOUNTER — Other Ambulatory Visit: Payer: Self-pay | Admitting: Medical-Surgical

## 2021-06-29 MED ORDER — VENLAFAXINE HCL ER 75 MG PO CP24: ORAL_CAPSULE | ORAL | 1 refills | Status: DC

## 2021-09-29 ENCOUNTER — Encounter: Payer: Self-pay | Admitting: Medical-Surgical

## 2021-09-30 ENCOUNTER — Other Ambulatory Visit: Payer: Self-pay | Admitting: Medical-Surgical

## 2021-09-30 MED ORDER — BUPROPION HCL ER (XL) 150 MG PO TB24: 150.0000 mg | ORAL_TABLET | Freq: Every morning | ORAL | 2 refills | Status: DC

## 2021-10-05 ENCOUNTER — Other Ambulatory Visit: Payer: Self-pay

## 2021-10-05 MED ORDER — FAMOTIDINE 20 MG PO TABS
20.0000 mg | ORAL_TABLET | Freq: Two times a day (BID) | ORAL | 0 refills | Status: DC
Start: 2021-10-05 — End: 2021-11-05

## 2021-11-05 ENCOUNTER — Ambulatory Visit: Payer: Managed Care, Other (non HMO) | Admitting: Medical-Surgical

## 2021-11-05 ENCOUNTER — Encounter: Payer: Self-pay | Admitting: Medical-Surgical

## 2021-11-05 VITALS — BP 123/78 | HR 86 | Resp 20 | Ht 73.62 in | Wt 227.9 lb

## 2021-11-05 DIAGNOSIS — K219 Gastro-esophageal reflux disease without esophagitis: Secondary | ICD-10-CM

## 2021-11-05 DIAGNOSIS — Z23 Encounter for immunization: Secondary | ICD-10-CM | POA: Diagnosis not present

## 2021-11-05 DIAGNOSIS — Z113 Encounter for screening for infections with a predominantly sexual mode of transmission: Secondary | ICD-10-CM

## 2021-11-05 DIAGNOSIS — Z789 Other specified health status: Secondary | ICD-10-CM | POA: Diagnosis not present

## 2021-11-05 DIAGNOSIS — F39 Unspecified mood [affective] disorder: Secondary | ICD-10-CM | POA: Diagnosis not present

## 2021-11-05 MED ORDER — VENLAFAXINE HCL ER 37.5 MG PO CP24: ORAL_CAPSULE | ORAL | 1 refills | Status: DC

## 2021-11-05 MED ORDER — PROGESTERONE MICRONIZED 100 MG PO CAPS: 100.0000 mg | ORAL_CAPSULE | Freq: Every day | ORAL | 1 refills | Status: DC

## 2021-11-05 MED ORDER — FAMOTIDINE 20 MG PO TABS: 20.0000 mg | ORAL_TABLET | Freq: Two times a day (BID) | ORAL | 1 refills | Status: DC

## 2021-11-05 NOTE — Progress Notes (Signed)
Established Patient Office Visit  Subjective   Patient ID: Bruce Zuniga, transgender male   DOB: 02-Jun-2000 Age: 21 y.o. MRN: [id number]   Chief Complaint  Patient presents with   Follow-up   transgender   HPI Pleasant 21 year old transgender male presenting today for follow-up on transgender care.  Notes that he has been taking estradiol 2 mg once daily rather than twice daily.  Has also completely stopped spironolactone and been off of the dosing for 2 weeks.  Feels that he has reached a point where he is happy with the physical effects of the estradiol and feels good in his body.  Also reports that he has come to the conclusion that testosterone is not his enemy.  Has also been taking progesterone 200 mg daily.  Is interested in reducing this however the medication comes in a capsule and he cannot cut it in half.  Has recently become sexually active again after a very long time and reports that the changes and transgender care medications have improved sexual function.  Requesting STI testing.  Mood: Notes that he has OCD and it can cause significant flares.  Reports that Wellbutrin 150 mg daily helps his tremendously and he has found over the years that he cannot do without that medication.  He is not currently doing any counseling related to insurance issues however is interested in getting restarted.  Also taking venlafaxine 75 mg daily but is very interested in reducing this dose as well and eventually discontinuing the medication if symptoms do not worsen.  Denies SI/HI.   Objective:    Vitals:   11/05/21 1358  BP: 123/78  Pulse: 86  Resp: 20  Height: 6' 1.62" (1.87 m)  Weight: 227 lb 14.4 oz (103.4 kg)  SpO2: 98%  BMI (Calculated): 29.56    Physical Exam Vitals reviewed.  Constitutional:      General: He is not in acute distress.    Appearance: Normal appearance. He is not ill-appearing.  HENT:     Head: Normocephalic and atraumatic.  Cardiovascular:     Rate and  Rhythm: Normal rate and regular rhythm.     Pulses: Normal pulses.     Heart sounds: Normal heart sounds.  Pulmonary:     Effort: Pulmonary effort is normal. No respiratory distress.     Breath sounds: Normal breath sounds. No wheezing, rhonchi or rales.  Skin:    General: Skin is warm and dry.  Neurological:     Mental Status: He is alert and oriented to person, place, and time.  Psychiatric:        Mood and Affect: Mood normal.        Behavior: Behavior normal.        Thought Content: Thought content normal.        Judgment: Judgment normal.   No results found for this or any previous visit (from the past 24 hour(s)).     The ASCVD Risk score (Arnett DK, et al., 2019) failed to calculate for the following reasons:   The 2019 ASCVD risk score is only valid for ages 56 to 40   Assessment & Plan:   1. Male-to-male transgender person Checking labs as below.  As long as there are no concerning symptoms, okay to reduce doses as desired.  Continue estradiol 2 mg daily.  Discontinuing spironolactone per patient request.  Reducing progesterone to 100 mg daily. - Testosterone - Estradiol - CBC with Differential/Platelet - COMPLETE METABOLIC PANEL WITH GFR - Lipid panel -  progesterone (PROMETRIUM) 100 MG capsule; Take 1 capsule (100 mg total) by mouth daily.  Dispense: 30 capsule; Refill: 1  2. Routine screening for STI (sexually transmitted infection) STI testing as below. - HIV Antibody (routine testing w rflx) - Hepatitis C Antibody - Hepatitis B surface antigen - RPR - C. trachomatis/N. gonorrhoeae RNA  3. Need for influenza vaccination Flu vaccine given in office today. - Flu Vaccine QUAD 6+ mos PF IM (Fluarix Quad PF)  4. Mood disorder (HCC) Reducing venlafaxine to 37.5 mg daily.  Continue Wellbutrin 150 mg daily.  Continue BuSpar 10 mg twice daily. - venlafaxine XR (EFFEXOR-XR) 37.5 MG 24 hr capsule; TAKE 1 CAPSULE(75 MG) BY MOUTH DAILY WITH BREAKFAST  Dispense: 90  capsule; Refill: 1  5. Gastroesophageal reflux disease without esophagitis Stable.  Continue famotidine 20 mg twice daily. - famotidine (PEPCID) 20 MG tablet; Take 1 tablet (20 mg total) by mouth 2 (two) times daily.  Dispense: 180 tablet; Refill: 1  Return in about 6 months (around 05/07/2022) for transgender care.  ___________________________________________ Clearnce Sorrel, DNP, APRN, FNP-BC [facility]

## 2021-11-06 LAB — COMPLETE METABOLIC PANEL WITH GFR
AG Ratio: 1.8 (calc) (ref 1.0–2.5)
ALT: 10 U/L (ref 9–46)
AST: 14 U/L (ref 10–40)
Albumin: 4.6 g/dL (ref 3.6–5.1)
Alkaline phosphatase (APISO): 76 U/L (ref 36–130)
BUN: 11 mg/dL (ref 7–25)
CO2: 30 mmol/L (ref 20–32)
Calcium: 9.8 mg/dL (ref 8.6–10.3)
Chloride: 106 mmol/L (ref 98–110)
Creat: 0.96 mg/dL (ref 0.60–1.24)
Globulin: 2.6 g/dL (calc) (ref 1.9–3.7)
Glucose, Bld: 87 mg/dL (ref 65–99)
Potassium: 4.3 mmol/L (ref 3.5–5.3)
Sodium: 141 mmol/L (ref 135–146)
Total Bilirubin: 0.6 mg/dL (ref 0.2–1.2)
Total Protein: 7.2 g/dL (ref 6.1–8.1)
eGFR: 114 mL/min/{1.73_m2} (ref >=60)

## 2021-11-06 LAB — CBC WITH DIFFERENTIAL/PLATELET
Absolute Monocytes: 432 cells/uL (ref 200–950)
Basophils Absolute: 49 cells/uL (ref 0–200)
Basophils Relative: 0.9 %
Eosinophils Absolute: 167 cells/uL (ref 15–500)
Eosinophils Relative: 3.1 %
HCT: 42.7 % (ref 38.5–50.0)
Hemoglobin: 14.3 g/dL (ref 13.2–17.1)
Lymphs Abs: 1890 cells/uL (ref 850–3900)
MCH: 29.2 pg (ref 27.0–33.0)
MCHC: 33.5 g/dL (ref 32.0–36.0)
MCV: 87.3 fL (ref 80.0–100.0)
MPV: 10.2 fL (ref 7.5–12.5)
Monocytes Relative: 8 %
Neutro Abs: 2862 cells/uL (ref 1500–7800)
Neutrophils Relative %: 53 %
Platelets: 266 10*3/uL (ref 140–400)
RBC: 4.89 10*6/uL (ref 4.20–5.80)
RDW: 12.1 % (ref 11.0–15.0)
Total Lymphocyte: 35 %
WBC: 5.4 10*3/uL (ref 3.8–10.8)

## 2021-11-06 LAB — HEPATITIS B SURFACE ANTIGEN: Hepatitis B Surface Ag: NONREACTIVE

## 2021-11-06 LAB — LIPID PANEL
Cholesterol: 153 mg/dL (ref <200)
HDL: 50 mg/dL (ref >=40)
LDL Cholesterol (Calc): 74 mg/dL (calc)
Non-HDL Cholesterol (Calc): 103 mg/dL (calc) (ref <130)
Total CHOL/HDL Ratio: 3.1 (calc) (ref <5.0)
Triglycerides: 194 mg/dL — ABNORMAL HIGH (ref <150)

## 2021-11-06 LAB — HIV ANTIBODY (ROUTINE TESTING W REFLEX): HIV 1&2 Ab, 4th Generation: NONREACTIVE

## 2021-11-06 LAB — TESTOSTERONE: Testosterone: 546 ng/dL (ref 250–827)

## 2021-11-06 LAB — C. TRACHOMATIS/N. GONORRHOEAE RNA
C. trachomatis RNA, TMA: NOT DETECTED
N. gonorrhoeae RNA, TMA: NOT DETECTED

## 2021-11-06 LAB — HEPATITIS C ANTIBODY: Hepatitis C Ab: NONREACTIVE

## 2021-11-06 LAB — ESTRADIOL: Estradiol: 40 pg/mL — ABNORMAL HIGH (ref <=39)

## 2021-11-06 LAB — RPR: RPR Ser Ql: NONREACTIVE

## 2022-02-03 ENCOUNTER — Encounter: Payer: Self-pay | Admitting: Medical-Surgical

## 2022-02-04 ENCOUNTER — Other Ambulatory Visit: Payer: Self-pay | Admitting: Medical-Surgical

## 2022-02-04 DIAGNOSIS — F39 Unspecified mood [affective] disorder: Secondary | ICD-10-CM

## 2022-02-04 DIAGNOSIS — Z789 Other specified health status: Secondary | ICD-10-CM

## 2022-02-04 MED ORDER — PROGESTERONE 200 MG PO CAPS: 200.0000 mg | ORAL_CAPSULE | Freq: Every day | ORAL | 1 refills | Status: DC

## 2022-02-04 MED ORDER — VENLAFAXINE HCL ER 75 MG PO CP24: ORAL_CAPSULE | ORAL | 1 refills | Status: DC

## 2022-03-24 ENCOUNTER — Other Ambulatory Visit: Payer: Self-pay

## 2022-03-24 MED ORDER — BUPROPION HCL ER (XL) 150 MG PO TB24: 150.0000 mg | ORAL_TABLET | Freq: Every morning | ORAL | 1 refills | Status: DC

## 2022-05-06 ENCOUNTER — Ambulatory Visit: Payer: Managed Care, Other (non HMO) | Admitting: Medical-Surgical

## 2022-05-10 ENCOUNTER — Encounter: Payer: Self-pay | Admitting: Medical-Surgical

## 2022-05-11 ENCOUNTER — Ambulatory Visit: Payer: Managed Care, Other (non HMO) | Admitting: Medical-Surgical

## 2022-05-11 ENCOUNTER — Telehealth (INDEPENDENT_AMBULATORY_CARE_PROVIDER_SITE_OTHER): Payer: Managed Care, Other (non HMO) | Admitting: Medical-Surgical

## 2022-05-11 ENCOUNTER — Encounter: Payer: Self-pay | Admitting: Medical-Surgical

## 2022-05-11 VITALS — Ht 74.0 in

## 2022-05-11 DIAGNOSIS — F39 Unspecified mood [affective] disorder: Secondary | ICD-10-CM

## 2022-05-11 DIAGNOSIS — Z789 Other specified health status: Secondary | ICD-10-CM

## 2022-05-11 DIAGNOSIS — K219 Gastro-esophageal reflux disease without esophagitis: Secondary | ICD-10-CM | POA: Diagnosis not present

## 2022-05-11 MED ORDER — VENLAFAXINE HCL ER 37.5 MG PO CP24
37.5000 mg | ORAL_CAPSULE | Freq: Every day | ORAL | 1 refills | Status: DC
Start: 2022-05-11 — End: 2022-11-11

## 2022-05-11 MED ORDER — FAMOTIDINE 20 MG PO TABS: 20.0000 mg | ORAL_TABLET | Freq: Two times a day (BID) | ORAL | 1 refills | Status: DC

## 2022-05-11 MED ORDER — ESTRADIOL 2 MG PO TABS: 2.0000 mg | ORAL_TABLET | Freq: Every day | ORAL | 1 refills | Status: DC

## 2022-05-11 MED ORDER — PROGESTERONE 200 MG PO CAPS: 200.0000 mg | ORAL_CAPSULE | Freq: Every day | ORAL | 1 refills | Status: DC

## 2022-05-11 MED ORDER — BUPROPION HCL ER (XL) 150 MG PO TB24: 150.0000 mg | ORAL_TABLET | Freq: Every morning | ORAL | 1 refills | Status: DC

## 2022-05-11 MED ORDER — VENLAFAXINE HCL ER 75 MG PO CP24: ORAL_CAPSULE | ORAL | 1 refills | Status: DC

## 2022-05-11 NOTE — Progress Notes (Signed)
Virtual Visit via Video Note  I connected with Bruce Zuniga on 05/11/22 at  1:00 PM EDT by a video enabled telemedicine application and verified that I am speaking with the correct person using two identifiers.   I discussed the limitations of evaluation and management by telemedicine and the availability of in person appointments. The patient expressed understanding and agreed to proceed.  Patient location: home Provider locations: office  Subjective:    CC: Medication follow-up  HPI: Pleasant 22 year old transgender presenting via MyChart video visit today to discuss the following:  Transgender care: Taking estradiol 2 mg daily along with progesterone 200 mg daily.  Tolerating both medications well without side effects.  Unfortunately ran out of estradiol and has been off of this for 2 weeks but has continue the progesterone as prescribed.  Happy with the overall results of the medications.  Due for labs.  Mood: History of anxiety and depression with previous treatment of Effexor and Wellbutrin.  Has been taking Effexor 75 mg daily and Wellbutrin 150 mg daily.  Tolerating both medications well without side effects.  Previously desired to cut medications to smaller doses however life got very stressful and with a new job and recent increase in stress, is now interested and actually increasing the Effexor dose.  Notes that the some muscle spasms and nervous tics have emerged with the increase in stress but is monitoring this very closely and feels that once the new job stress wears off, this will improve.  Denies SI/HI.  GERD: Taking famotidine 20 mg twice daily as needed.  Tolerating well without side effects.  Feels medication is working very well to keep symptoms managed.  Past medical history, Surgical history, Family history not pertinant except as noted below, Social history, Allergies, and medications have been entered into the medical record, reviewed, and corrections made.   Review  of Systems: See HPI for pertinent positives and negatives.   Objective:    General: Speaking clearly in complete sentences without any shortness of breath.  Alert and oriented x3.  Normal judgment. No apparent acute distress.  Impression and Recommendations:    1. Male-to-male transgender person Doing well on current regimen.  Refilling estradiol and progesterone as requested.  Recommend continuing daily therapy without missed doses for approximately 6-8 weeks then recheck estradiol and testosterone.  Lab orders placed to have this done without needing an extra appointment. - estradiol (ESTRACE) 2 MG tablet; Take 1 tablet (2 mg total) by mouth daily.  Dispense: 90 tablet; Refill: 1 - progesterone (PROMETRIUM) 200 MG capsule; Take 1 capsule (200 mg total) by mouth daily.  Dispense: 90 capsule; Refill: 1 - Testosterone - Estradiol  2. Mood disorder Increasing Effexor to 112.5 mg daily.  Continue Wellbutrin 150 mg daily.  Discussed possible counseling.  Unclear if there are any providers in the area that are transgender family that will be in network.  Reaching out to our referral coordinator and I will plan to send the names of the providers we have so that insurance coverage can be verified.  They will let me know if they would like me to place an official referral. - buPROPion (WELLBUTRIN XL) 150 MG 24 hr tablet; Take 1 tablet (150 mg total) by mouth every morning.  Dispense: 90 tablet; Refill: 1 - venlafaxine XR (EFFEXOR-XR) 75 MG 24 hr capsule; TAKE 1 CAPSULE(75 MG) BY MOUTH DAILY WITH BREAKFAST  Dispense: 90 capsule; Refill: 1 - venlafaxine XR (EFFEXOR XR) 37.5 MG 24 hr capsule; Take 1 capsule (  37.5 mg total) by mouth daily with breakfast. Take in addition to 75 mg daily to equal 112.5 mg daily.  Dispense: 90 capsule; Refill: 1  3. Gastroesophageal reflux disease without esophagitis Well-controlled.  Continue famotidine 20 mg twice daily as needed. - famotidine (PEPCID) 20 MG tablet; Take  1 tablet (20 mg total) by mouth 2 (two) times daily.  Dispense: 180 tablet; Refill: 1  I discussed the assessment and treatment plan with the patient. The patient was provided an opportunity to ask questions and all were answered. The patient agreed with the plan and demonstrated an understanding of the instructions.   The patient was advised to call back or seek an in-person evaluation if the symptoms worsen or if the condition fails to improve as anticipated.  25 minutes of non-face-to-face time was provided during this encounter.  Return in about 6 weeks (around 06/22/2022) for mood follow up.  Thayer Ohm, DNP, APRN, FNP-BC [facility] and [facility] Medicine

## 2022-05-12 ENCOUNTER — Encounter: Payer: Self-pay | Admitting: Medical-Surgical

## 2022-06-03 ENCOUNTER — Encounter: Payer: Self-pay | Admitting: Medical-Surgical

## 2022-06-03 DIAGNOSIS — Z202 Contact with and (suspected) exposure to infections with a predominantly sexual mode of transmission: Secondary | ICD-10-CM

## 2022-07-06 ENCOUNTER — Encounter: Payer: Self-pay | Admitting: Medical-Surgical

## 2022-07-08 LAB — RPR: RPR Ser Ql: NONREACTIVE

## 2022-07-08 LAB — HIV ANTIBODY (ROUTINE TESTING W REFLEX): HIV 1&2 Ab, 4th Generation: NONREACTIVE

## 2022-07-08 LAB — C. TRACHOMATIS/N. GONORRHOEAE RNA
C. trachomatis RNA, TMA: NOT DETECTED
N. gonorrhoeae RNA, TMA: NOT DETECTED

## 2022-07-08 LAB — HEPATITIS B SURFACE ANTIGEN: Hepatitis B Surface Ag: NONREACTIVE

## 2022-07-08 LAB — HEPATITIS C ANTIBODY: Hepatitis C Ab: NONREACTIVE

## 2022-08-05 ENCOUNTER — Other Ambulatory Visit: Payer: Self-pay | Admitting: Medical-Surgical

## 2022-08-05 DIAGNOSIS — F39 Unspecified mood [affective] disorder: Secondary | ICD-10-CM

## 2022-08-12 ENCOUNTER — Encounter: Payer: Self-pay | Admitting: Medical-Surgical

## 2022-08-12 DIAGNOSIS — F4323 Adjustment disorder with mixed anxiety and depressed mood: Secondary | ICD-10-CM

## 2022-08-12 MED ORDER — HYDROXYZINE HCL 10 MG PO TABS
ORAL_TABLET | ORAL | 3 refills | Status: DC
Start: 2022-08-12 — End: 2023-05-19

## 2022-11-01 ENCOUNTER — Other Ambulatory Visit: Payer: Self-pay

## 2022-11-01 DIAGNOSIS — Z789 Other specified health status: Secondary | ICD-10-CM

## 2022-11-01 MED ORDER — ESTRADIOL 2 MG PO TABS
2.0000 mg | ORAL_TABLET | Freq: Every day | ORAL | 0 refills | Status: DC
Start: 2022-11-01 — End: 2023-02-01

## 2022-11-06 ENCOUNTER — Other Ambulatory Visit: Payer: Self-pay | Admitting: Medical-Surgical

## 2022-11-06 DIAGNOSIS — F39 Unspecified mood [affective] disorder: Secondary | ICD-10-CM

## 2022-11-11 ENCOUNTER — Other Ambulatory Visit: Payer: Self-pay

## 2022-11-11 DIAGNOSIS — F39 Unspecified mood [affective] disorder: Secondary | ICD-10-CM

## 2022-11-11 MED ORDER — VENLAFAXINE HCL ER 37.5 MG PO CP24: 37.5000 mg | ORAL_CAPSULE | Freq: Every day | ORAL | 0 refills | Status: DC

## 2022-11-30 ENCOUNTER — Encounter: Payer: Self-pay | Admitting: Medical-Surgical

## 2022-11-30 DIAGNOSIS — F39 Unspecified mood [affective] disorder: Secondary | ICD-10-CM

## 2022-11-30 MED ORDER — BUPROPION HCL ER (XL) 150 MG PO TB24: 150.0000 mg | ORAL_TABLET | Freq: Every morning | ORAL | 0 refills | Status: DC

## 2022-12-28 ENCOUNTER — Other Ambulatory Visit: Payer: Self-pay | Admitting: Medical-Surgical

## 2022-12-28 DIAGNOSIS — F39 Unspecified mood [affective] disorder: Secondary | ICD-10-CM

## 2022-12-29 ENCOUNTER — Other Ambulatory Visit: Payer: Self-pay | Admitting: Medical-Surgical

## 2022-12-29 DIAGNOSIS — F39 Unspecified mood [affective] disorder: Secondary | ICD-10-CM

## 2022-12-30 MED ORDER — BUPROPION HCL ER (XL) 150 MG PO TB24: 150.0000 mg | ORAL_TABLET | Freq: Every morning | ORAL | 0 refills | Status: DC

## 2022-12-30 NOTE — Telephone Encounter (Signed)
Pls contact the patient to schedule appt with [facility] for buproprion refill. Sending 15 days. No additional refills without kept appt.

## 2022-12-30 NOTE — Telephone Encounter (Signed)
Called patient, Bruce Zuniga

## 2023-01-07 ENCOUNTER — Encounter: Payer: Self-pay | Admitting: Medical-Surgical

## 2023-01-07 ENCOUNTER — Telehealth: Payer: Managed Care, Other (non HMO) | Admitting: Medical-Surgical

## 2023-01-07 DIAGNOSIS — F39 Unspecified mood [affective] disorder: Secondary | ICD-10-CM

## 2023-01-07 MED ORDER — BUPROPION HCL ER (XL) 150 MG PO TB24: 150.0000 mg | ORAL_TABLET | Freq: Every morning | ORAL | 0 refills | Status: DC

## 2023-01-07 MED ORDER — VENLAFAXINE HCL ER 75 MG PO CP24: ORAL_CAPSULE | ORAL | 0 refills | Status: DC

## 2023-01-07 MED ORDER — VENLAFAXINE HCL ER 37.5 MG PO CP24: 37.5000 mg | ORAL_CAPSULE | Freq: Every day | ORAL | 0 refills | Status: DC

## 2023-01-07 NOTE — Progress Notes (Signed)
Virtual Visit via Video Note  I connected with Bruce Zuniga on 01/07/23 at  2:00 PM EST by a video enabled telemedicine application and verified that I am speaking with the correct person using two identifiers.   I discussed the limitations of evaluation and management by telemedicine and the availability of in person appointments. The patient expressed understanding and agreed to proceed.  Patient location: home Provider locations: office  Subjective:    CC: Mood follow-up  HPI: Pleasant 22 year old patient presenting via MyChart video visit to discuss medication refills.  Has a history of mood disorder and has been treated with Effexor 112.5 mg daily along with Wellbutrin XL 150 mg daily.  Tolerating these medications well without side effects.  Feels that their mood is stable and has no current concerns.  Reports that they are still taking Estrace and progesterone as prescribed.  Would like to have full hormone testing in the near future to evaluate their status as it has been a while since we were able to complete this.   Past medical history, Surgical history, Family history not pertinant except as noted below, Social history, Allergies, and medications have been entered into the medical record, reviewed, and corrections made.   Review of Systems: See HPI for pertinent positives and negatives.   Objective:    General: Speaking clearly in complete sentences without any shortness of breath.  Alert and oriented x3.  Normal judgment. No apparent acute distress.  Impression and Recommendations:    1. Mood disorder (HCC) Symptoms currently well-managed.  Continue Effexor 75 mg +37.5 mg once daily in the morning.  Continue Wellbutrin 150 mg daily.  Plan for in person follow-up to discuss hormone replacement therapy. - buPROPion (WELLBUTRIN XL) 150 MG 24 hr tablet; Take 1 tablet (150 mg total) by mouth every morning.  Dispense: 90 tablet; Refill: 0 - venlafaxine XR (EFFEXOR-XR) 37.5 MG  24 hr capsule; Take 1 capsule (37.5 mg total) by mouth daily with breakfast.  Dispense: 90 capsule; Refill: 0 - venlafaxine XR (EFFEXOR-XR) 75 MG 24 hr capsule; TAKE 1 CAPSULE(75 MG) BY MOUTH DAILY WITH BREAKFAST.  Dispense: 90 capsule; Refill: 0  I discussed the assessment and treatment plan with the patient. The patient was provided an opportunity to ask questions and all were answered. The patient agreed with the plan and demonstrated an understanding of the instructions.   The patient was advised to call back or seek an in-person evaluation if the symptoms worsen or if the condition fails to improve as anticipated.  Return if symptoms worsen or fail to improve.  Thayer Ohm, DNP, APRN, FNP-BC [facility] and [facility] Medicine

## 2023-01-12 ENCOUNTER — Other Ambulatory Visit: Payer: Self-pay | Admitting: Medical-Surgical

## 2023-01-12 DIAGNOSIS — F39 Unspecified mood [affective] disorder: Secondary | ICD-10-CM

## 2023-01-30 ENCOUNTER — Other Ambulatory Visit: Payer: Self-pay | Admitting: Medical-Surgical

## 2023-01-30 DIAGNOSIS — Z789 Other specified health status: Secondary | ICD-10-CM

## 2023-02-01 ENCOUNTER — Ambulatory Visit: Payer: Managed Care, Other (non HMO) | Admitting: Medical-Surgical

## 2023-02-01 ENCOUNTER — Encounter: Payer: Self-pay | Admitting: Medical-Surgical

## 2023-02-01 VITALS — BP 132/86 | HR 78 | Resp 20 | Ht 74.0 in | Wt 214.4 lb

## 2023-02-01 DIAGNOSIS — Z23 Encounter for immunization: Secondary | ICD-10-CM

## 2023-02-01 DIAGNOSIS — F39 Unspecified mood [affective] disorder: Secondary | ICD-10-CM | POA: Diagnosis not present

## 2023-02-01 DIAGNOSIS — Z789 Other specified health status: Secondary | ICD-10-CM

## 2023-02-01 NOTE — Progress Notes (Signed)
        Established patient visit  History, exam, impression, and plan:  1. Male-to-male transgender person (Primary) Pleasant 23 year old patient presenting today for follow-up on transgender hormone therapy.  Currently taking estradiol  2 mg daily along with progesterone  200 mg daily.  Tolerating both medications well without side effects.  Due for labs so getting those today.  Is considering coming off of the hormone replacement therapy depending on lab results tomorrow.  Feels like the ratio of hormones may be off a bit but would like to base it off of the lab results.  He will let me know what he would like to do once results are available. - Estradiol  - Testosterone  - CBC with Differential/Platelet - CMP14+EGFR - Lipid panel  2. Mood disorder (HCC) Doing well on Effexor  112.5 mg daily, tolerating well without side effects.  Also taking Wellbutrin  150 mg daily which seems to work well.  Denies SI/HI.  Mood and affect normal.  Continue Wellbutrin  and Effexor  as prescribed.  3. Need for HPV vaccine HPV #1 given in office today.  Next due in 1-2 months.  Okay to schedule as a nurse visit. - HPV 9-valent vaccine,Recombinat  4. Need for COVID-19 vaccine COVID-vaccine given in office today. - Pfizer Comirnaty Covid -19 Vaccine 62yrs and older  5. Need for influenza vaccination Flu vaccine given in office today. - Flu vaccine trivalent PF, 6mos and older(Flulaval,Afluria,Fluarix,Fluzone)  Note: Patient is also due for a Tdap.  Okay to do this during the nurse visit to get the second HPV vaccine.  Procedures performed this visit: None.  Return in about 6 months (around 08/01/2023) for chronic disease follow up.  __________________________________ Zada FREDRIK Palin, DNP, APRN, FNP-BC [facility] [place]

## 2023-02-02 LAB — CMP14+EGFR
ALT: 8 [IU]/L (ref 0–44)
AST: 15 [IU]/L (ref 0–40)
Albumin: 4.5 g/dL (ref 4.3–5.2)
Alkaline Phosphatase: 98 [IU]/L (ref 44–121)
BUN/Creatinine Ratio: 9 (ref 9–20)
BUN: 8 mg/dL (ref 6–20)
Bilirubin Total: 0.3 mg/dL (ref 0.0–1.2)
CO2: 26 mmol/L (ref 20–29)
Calcium: 9.5 mg/dL (ref 8.7–10.2)
Chloride: 103 mmol/L (ref 96–106)
Creatinine, Ser: 0.9 mg/dL (ref 0.76–1.27)
Globulin, Total: 2.1 g/dL (ref 1.5–4.5)
Glucose: 88 mg/dL (ref 70–99)
Potassium: 4.3 mmol/L (ref 3.5–5.2)
Sodium: 139 mmol/L (ref 134–144)
Total Protein: 6.6 g/dL (ref 6.0–8.5)
eGFR: 122 mL/min/{1.73_m2} (ref >59)

## 2023-02-02 LAB — CBC WITH DIFFERENTIAL/PLATELET
Basophils Absolute: 0.1 10*3/uL (ref 0.0–0.2)
Basos: 1 %
EOS (ABSOLUTE): 0.2 10*3/uL (ref 0.0–0.4)
Eos: 3 %
Hematocrit: 44.2 % (ref 37.5–51.0)
Hemoglobin: 14.9 g/dL (ref 13.0–17.7)
Immature Grans (Abs): 0 10*3/uL (ref 0.0–0.1)
Immature Granulocytes: 0 %
Lymphocytes Absolute: 1.9 10*3/uL (ref 0.7–3.1)
Lymphs: 38 %
MCH: 29.4 pg (ref 26.6–33.0)
MCHC: 33.7 g/dL (ref 31.5–35.7)
MCV: 87 fL (ref 79–97)
Monocytes Absolute: 0.4 10*3/uL (ref 0.1–0.9)
Monocytes: 8 %
Neutrophils Absolute: 2.6 10*3/uL (ref 1.4–7.0)
Neutrophils: 50 %
Platelets: 259 10*3/uL (ref 150–450)
RBC: 5.07 x10E6/uL (ref 4.14–5.80)
RDW: 11.7 % (ref 11.6–15.4)
WBC: 5.1 10*3/uL (ref 3.4–10.8)

## 2023-02-02 LAB — LIPID PANEL
Chol/HDL Ratio: 3.6 {ratio} (ref 0.0–5.0)
Cholesterol, Total: 167 mg/dL (ref 100–199)
HDL: 46 mg/dL (ref >39)
LDL Chol Calc (NIH): 93 mg/dL (ref 0–99)
Triglycerides: 162 mg/dL — ABNORMAL HIGH (ref 0–149)
VLDL Cholesterol Cal: 28 mg/dL (ref 5–40)

## 2023-02-02 LAB — TESTOSTERONE: Testosterone: 532 ng/dL (ref 264–916)

## 2023-02-02 LAB — ESTRADIOL: Estradiol: 42.5 pg/mL (ref 7.6–42.6)

## 2023-02-08 ENCOUNTER — Encounter: Payer: Self-pay | Admitting: Medical-Surgical

## 2023-02-15 ENCOUNTER — Other Ambulatory Visit: Payer: Self-pay | Admitting: Medical-Surgical

## 2023-02-15 ENCOUNTER — Encounter: Payer: Self-pay | Admitting: Medical-Surgical

## 2023-02-15 DIAGNOSIS — F39 Unspecified mood [affective] disorder: Secondary | ICD-10-CM

## 2023-02-15 MED ORDER — VENLAFAXINE HCL ER 75 MG PO CP24: ORAL_CAPSULE | ORAL | 1 refills | Status: DC

## 2023-02-15 MED ORDER — VENLAFAXINE HCL ER 37.5 MG PO CP24: 37.5000 mg | ORAL_CAPSULE | Freq: Every day | ORAL | 1 refills | Status: DC

## 2023-04-05 ENCOUNTER — Ambulatory Visit (INDEPENDENT_AMBULATORY_CARE_PROVIDER_SITE_OTHER): Payer: Managed Care, Other (non HMO) | Admitting: Medical-Surgical

## 2023-04-05 VITALS — Temp 98.5°F

## 2023-04-05 DIAGNOSIS — Z23 Encounter for immunization: Secondary | ICD-10-CM | POA: Diagnosis not present

## 2023-04-05 NOTE — Progress Notes (Signed)
 Pt is here to have 2nd HPV vaccination done.   He tolerated well.

## 2023-04-09 ENCOUNTER — Other Ambulatory Visit: Payer: Self-pay | Admitting: Medical-Surgical

## 2023-04-09 DIAGNOSIS — F39 Unspecified mood [affective] disorder: Secondary | ICD-10-CM

## 2023-04-22 ENCOUNTER — Other Ambulatory Visit: Payer: Self-pay | Admitting: Medical-Surgical

## 2023-04-22 DIAGNOSIS — F39 Unspecified mood [affective] disorder: Secondary | ICD-10-CM

## 2023-05-01 ENCOUNTER — Other Ambulatory Visit: Payer: Self-pay | Admitting: Medical-Surgical

## 2023-05-01 DIAGNOSIS — Z789 Other specified health status: Secondary | ICD-10-CM

## 2023-05-18 ENCOUNTER — Encounter: Payer: Self-pay | Admitting: Medical-Surgical

## 2023-05-18 DIAGNOSIS — F4323 Adjustment disorder with mixed anxiety and depressed mood: Secondary | ICD-10-CM

## 2023-05-19 MED ORDER — HYDROXYZINE HCL 10 MG PO TABS: ORAL_TABLET | ORAL | 3 refills | Status: DC

## 2023-05-19 NOTE — Telephone Encounter (Signed)
 Requesting rx rf of Hydroxyzine  10mg   Last written 08/12/2022 Last OV 02/01/2023 Upcoming appt 07/04/2023

## 2023-07-04 ENCOUNTER — Ambulatory Visit: Payer: Managed Care, Other (non HMO) | Admitting: Medical-Surgical

## 2023-07-12 ENCOUNTER — Other Ambulatory Visit: Payer: Self-pay | Admitting: Medical-Surgical

## 2023-07-12 ENCOUNTER — Encounter: Payer: Self-pay | Admitting: Medical-Surgical

## 2023-07-12 DIAGNOSIS — F39 Unspecified mood [affective] disorder: Secondary | ICD-10-CM

## 2023-07-12 NOTE — Telephone Encounter (Signed)
 Please advise on refill request

## 2023-07-29 ENCOUNTER — Other Ambulatory Visit: Payer: Self-pay | Admitting: Medical-Surgical

## 2023-07-29 DIAGNOSIS — Z789 Other specified health status: Secondary | ICD-10-CM

## 2023-09-07 ENCOUNTER — Other Ambulatory Visit: Payer: Self-pay | Admitting: Medical-Surgical

## 2023-09-07 DIAGNOSIS — K219 Gastro-esophageal reflux disease without esophagitis: Secondary | ICD-10-CM

## 2023-09-07 MED ORDER — FAMOTIDINE 20 MG PO TABS: 20.0000 mg | ORAL_TABLET | Freq: Two times a day (BID) | ORAL | 1 refills | Status: DC

## 2023-10-10 ENCOUNTER — Other Ambulatory Visit: Payer: Self-pay | Admitting: Family Medicine

## 2023-10-10 DIAGNOSIS — F39 Unspecified mood [affective] disorder: Secondary | ICD-10-CM

## 2023-11-10 ENCOUNTER — Other Ambulatory Visit: Payer: Self-pay | Admitting: Medical-Surgical

## 2023-11-10 DIAGNOSIS — F39 Unspecified mood [affective] disorder: Secondary | ICD-10-CM

## 2023-11-16 ENCOUNTER — Encounter: Payer: Self-pay | Admitting: Medical-Surgical

## 2023-11-16 NOTE — Telephone Encounter (Signed)
 Per patient, bupropion  requires a prior authorization. Thanks in advance.

## 2023-11-17 ENCOUNTER — Other Ambulatory Visit (HOSPITAL_COMMUNITY): Payer: Self-pay

## 2023-12-01 ENCOUNTER — Encounter: Payer: Self-pay | Admitting: Medical-Surgical

## 2023-12-01 ENCOUNTER — Ambulatory Visit: Admitting: Medical-Surgical

## 2023-12-01 VITALS — BP 126/76 | HR 67 | Resp 20 | Ht 74.0 in | Wt 197.8 lb

## 2023-12-01 DIAGNOSIS — K219 Gastro-esophageal reflux disease without esophagitis: Secondary | ICD-10-CM | POA: Diagnosis not present

## 2023-12-01 DIAGNOSIS — F4323 Adjustment disorder with mixed anxiety and depressed mood: Secondary | ICD-10-CM | POA: Diagnosis not present

## 2023-12-01 DIAGNOSIS — Z789 Other specified health status: Secondary | ICD-10-CM

## 2023-12-01 DIAGNOSIS — F39 Unspecified mood [affective] disorder: Secondary | ICD-10-CM | POA: Insufficient documentation

## 2023-12-01 DIAGNOSIS — Z23 Encounter for immunization: Secondary | ICD-10-CM | POA: Diagnosis not present

## 2023-12-01 MED ORDER — FAMOTIDINE 20 MG PO TABS: 20.0000 mg | ORAL_TABLET | Freq: Two times a day (BID) | ORAL | 3 refills | Status: AC

## 2023-12-01 MED ORDER — BUPROPION HCL ER (XL) 150 MG PO TB24: 150.0000 mg | ORAL_TABLET | Freq: Every day | ORAL | 3 refills | Status: AC

## 2023-12-01 MED ORDER — HYDROXYZINE HCL 10 MG PO TABS: ORAL_TABLET | ORAL | 3 refills | Status: AC

## 2023-12-01 MED ORDER — VENLAFAXINE HCL ER 75 MG PO CP24: ORAL_CAPSULE | ORAL | 3 refills | Status: AC

## 2023-12-01 NOTE — Progress Notes (Signed)
        Established patient visit   History of Present Illness   Discussed the use of AI scribe software for clinical note transcription with the patient, who gave verbal consent to proceed.  History of Present Illness   Bruce Zuniga is a 23 year old male who presents for medication management and follow-up.  Mood regulation and anxiety - Effexor  112.5mg  previously taken for mood regulation; currently using two 37.5 mg tablets daily after running out of 75 mg tablets - Feels the Effexor  75mg  is working well and won't need the higher dose  - Wellbutrin  XL 150mg  taken once daily for mood regulation - Hydroxyzine  10mg  used for anxiety, typically once daily, with occasional use twice daily if needed  Gastrointestinal symptoms - Pepcid  20mg  used as needed for gastrointestinal symptoms, one to two times daily  Hormone therapy discontinuation - Estradiol  discontinued over a year ago due to negative impact on energy levels and mood regulation - No current desire for hormone therapy   Physical Exam   Physical Exam Vitals reviewed.  Constitutional:      General: He is not in acute distress.    Appearance: Normal appearance. He is not ill-appearing.  HENT:     Head: Normocephalic and atraumatic.  Cardiovascular:     Rate and Rhythm: Normal rate and regular rhythm.     Pulses: Normal pulses.     Heart sounds: Normal heart sounds. No murmur heard.    No friction rub. No gallop.  Pulmonary:     Effort: Pulmonary effort is normal. No respiratory distress.     Breath sounds: Normal breath sounds. No wheezing.  Skin:    General: Skin is warm and dry.  Neurological:     Mental Status: He is alert and oriented to person, place, and time.  Psychiatric:        Mood and Affect: Mood normal.        Behavior: Behavior normal.        Thought Content: Thought content normal.        Judgment: Judgment normal.    Assessment & Plan     Adjustment disorder with mixed anxiety and  depressed mood Improved with Effexor  and Wellbutrin . Hydroxyzine  effective for anxiety. - Continue Effexor  75 mg daily. - Continue Wellbutrin  once daily. - Refilled hydroxyzine  for anxiety management.  Gastroesophageal reflux disease Controlled with famotidine  20mg  BID as needed. - Refilled famotidine  for GERD management.  Male to male transgender person Off estradiol  for over a year. Prefers accessory-based gender expression. Plans to restart when life stabilizes. - No current hormone therapy. - Will consider restarting hormone therapy when life stabilizes.  General Health Maintenance Blood work done in January. Plans for mid-year blood work due to insurance. - Schedule annual physical after January 7th for blood work and follow-up.     Follow up   Return for annual physical exam at your convenience. __________________________________ Zada FREDRIK Palin, DNP, APRN, FNP-BC [facility] Sports Medicine [facility] [place]

## 2023-12-02 ENCOUNTER — Encounter: Payer: Self-pay | Admitting: Medical-Surgical
